# Patient Record
Sex: Male | Born: 1990 | Race: White | Hispanic: No | Marital: Single | Smoking: Current every day smoker
Health system: Southern US, Community
[De-identification: ages and names within clinical notes are randomized; demographics above are authoritative.]

---
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
## 2016-05-08 ENCOUNTER — Encounter (HOSPITAL_COMMUNITY): Payer: Self-pay

## 2016-05-08 ENCOUNTER — Emergency Department (HOSPITAL_COMMUNITY): Payer: No Typology Code available for payment source

## 2016-05-08 ENCOUNTER — Emergency Department (HOSPITAL_COMMUNITY)
Admission: EM | Admit: 2016-05-08 | Discharge: 2016-05-08 | Disposition: A | Discharge status: Home / Self Care | Payer: No Typology Code available for payment source | Attending: Emergency Medicine | Admitting: Emergency Medicine

## 2016-05-08 DIAGNOSIS — M545 Low back pain: Secondary | ICD-10-CM | POA: Insufficient documentation

## 2016-05-08 DIAGNOSIS — Y999 Unspecified external cause status: Secondary | ICD-10-CM | POA: Insufficient documentation

## 2016-05-08 DIAGNOSIS — Y939 Activity, unspecified: Secondary | ICD-10-CM | POA: Diagnosis not present

## 2016-05-08 DIAGNOSIS — M542 Cervicalgia: Secondary | ICD-10-CM | POA: Diagnosis not present

## 2016-05-08 DIAGNOSIS — Y92410 Unspecified street and highway as the place of occurrence of the external cause: Secondary | ICD-10-CM | POA: Insufficient documentation

## 2016-05-08 DIAGNOSIS — S299XXA Unspecified injury of thorax, initial encounter: Secondary | ICD-10-CM | POA: Insufficient documentation

## 2016-05-08 DIAGNOSIS — S301XXA Contusion of abdominal wall, initial encounter: Secondary | ICD-10-CM | POA: Insufficient documentation

## 2016-05-08 DIAGNOSIS — F17210 Nicotine dependence, cigarettes, uncomplicated: Secondary | ICD-10-CM | POA: Insufficient documentation

## 2016-05-08 DIAGNOSIS — S3991XA Unspecified injury of abdomen, initial encounter: Secondary | ICD-10-CM | POA: Diagnosis present

## 2016-05-08 LAB — COMPREHENSIVE METABOLIC PANEL
ALBUMIN: 4.3 g/dL (ref 3.5–5.0)
ALK PHOS: 63 U/L (ref 38–126)
ALT: 23 U/L (ref 17–63)
ANION GAP: 9 (ref 5–15)
AST: 35 U/L (ref 15–41)
BILIRUBIN TOTAL: 0.5 mg/dL (ref 0.3–1.2)
BUN: 9 mg/dL (ref 6–20)
CALCIUM: 9.3 mg/dL (ref 8.9–10.3)
CO2: 24 mmol/L (ref 22–32)
Chloride: 107 mmol/L (ref 101–111)
Creatinine, Ser: 1.09 mg/dL (ref 0.61–1.24)
GFR calc non Af Amer: >60 mL/min (ref >60)
Glucose, Bld: 80 mg/dL (ref 65–99)
POTASSIUM: 4.1 mmol/L (ref 3.5–5.1)
SODIUM: 140 mmol/L (ref 135–145)
TOTAL PROTEIN: 6.5 g/dL (ref 6.5–8.1)

## 2016-05-08 LAB — CBC WITH DIFFERENTIAL/PLATELET
BASOS PCT: 0 %
Basophils Absolute: 0.1 10*3/uL (ref 0.0–0.1)
EOS ABS: 0.4 10*3/uL (ref 0.0–0.7)
Eosinophils Relative: 2 %
HEMATOCRIT: 47.8 % (ref 39.0–52.0)
HEMOGLOBIN: 16.8 g/dL (ref 13.0–17.0)
LYMPHS ABS: 2.6 10*3/uL (ref 0.7–4.0)
Lymphocytes Relative: 17 %
MCH: 30.5 pg (ref 26.0–34.0)
MCHC: 35.1 g/dL (ref 30.0–36.0)
MCV: 86.9 fL (ref 78.0–100.0)
MONO ABS: 0.7 10*3/uL (ref 0.1–1.0)
MONOS PCT: 4 %
NEUTROS PCT: 77 %
Neutro Abs: 11.3 10*3/uL — ABNORMAL HIGH (ref 1.7–7.7)
Platelets: 287 10*3/uL (ref 150–400)
RBC: 5.5 MIL/uL (ref 4.22–5.81)
RDW: 12.3 % (ref 11.5–15.5)
WBC: 15 10*3/uL — ABNORMAL HIGH (ref 4.0–10.5)

## 2016-05-08 MED ORDER — ONDANSETRON HCL 4 MG/2ML IJ SOLN
4.0000 mg | Freq: Once | INTRAMUSCULAR | Status: AC
  Administered 2016-05-08: 4 mg via INTRAVENOUS
  Filled 2016-05-08: qty 2

## 2016-05-08 MED ORDER — MORPHINE SULFATE (PF) 4 MG/ML IV SOLN
4.0000 mg | Freq: Once | INTRAVENOUS | Status: AC
  Administered 2016-05-08: 4 mg via INTRAVENOUS
  Filled 2016-05-08: qty 1

## 2016-05-08 MED ORDER — IBUPROFEN 400 MG PO TABS
400.0000 mg | ORAL_TABLET | Freq: Once | ORAL | Status: DC | PRN
  Filled 2016-05-08: qty 1

## 2016-05-08 MED ORDER — IBUPROFEN 400 MG PO TABS
ORAL_TABLET | ORAL | Status: AC
  Administered 2016-05-08: 400 mg
  Filled 2016-05-08: qty 1
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
## 2016-05-08 NOTE — ED Notes (Signed)
Pt requesting to leave ama, EDP aware. Pt was ambulatory & understood risks of leaving

## 2016-05-08 NOTE — ED Notes (Signed)
Pt was stating if he did not receive more pain medication that he would be leaving. Provider was made aware and pt decided to leave AMA. Pt did sign AMA form
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
## 2016-05-08 NOTE — ED Notes (Signed)
Pt requesting ibuprofen for pain.

## 2016-05-08 NOTE — ED Notes (Signed)
Doctor, hospitalHighway patrol officer in triage talking with patient.

## 2016-09-10 ENCOUNTER — Encounter (HOSPITAL_COMMUNITY): Payer: Self-pay | Admitting: Emergency Medicine

## 2016-09-10 ENCOUNTER — Emergency Department (HOSPITAL_COMMUNITY)
Admission: EM | Admit: 2016-09-10 | Discharge: 2016-09-10 | Discharge status: Against Medical Advice | Payer: Self-pay | Attending: Emergency Medicine | Admitting: Emergency Medicine

## 2016-09-10 DIAGNOSIS — F17210 Nicotine dependence, cigarettes, uncomplicated: Secondary | ICD-10-CM | POA: Insufficient documentation

## 2016-09-10 DIAGNOSIS — F29 Unspecified psychosis not due to a substance or known physiological condition: Secondary | ICD-10-CM | POA: Insufficient documentation

## 2016-09-10 DIAGNOSIS — F1910 Other psychoactive substance abuse, uncomplicated: Secondary | ICD-10-CM | POA: Insufficient documentation

## 2016-09-10 LAB — I-STAT CHEM 8, ED
BUN: 19 mg/dL (ref 6–20)
Calcium, Ion: 1.19 mmol/L (ref 1.15–1.40)
Chloride: 104 mmol/L (ref 101–111)
Creatinine, Ser: 1.1 mg/dL (ref 0.61–1.24)
Glucose, Bld: 84 mg/dL (ref 65–99)
HEMATOCRIT: 47 % (ref 39.0–52.0)
HEMOGLOBIN: 16 g/dL (ref 13.0–17.0)
POTASSIUM: 3.8 mmol/L (ref 3.5–5.1)
SODIUM: 140 mmol/L (ref 135–145)
TCO2: 27 mmol/L (ref 0–100)

## 2016-09-10 LAB — CBC WITH DIFFERENTIAL/PLATELET
BASOS ABS: 0 10*3/uL (ref 0.0–0.1)
Basophils Relative: 1 %
EOS PCT: 5 %
Eosinophils Absolute: 0.3 10*3/uL (ref 0.0–0.7)
HEMATOCRIT: 47.6 % (ref 39.0–52.0)
Hemoglobin: 16.3 g/dL (ref 13.0–17.0)
LYMPHS ABS: 2 10*3/uL (ref 0.7–4.0)
LYMPHS PCT: 30 %
MCH: 29.7 pg (ref 26.0–34.0)
MCHC: 34.2 g/dL (ref 30.0–36.0)
MCV: 86.7 fL (ref 78.0–100.0)
MONOS PCT: 8 %
Monocytes Absolute: 0.6 10*3/uL (ref 0.1–1.0)
Neutro Abs: 3.8 10*3/uL (ref 1.7–7.7)
Neutrophils Relative %: 56 %
PLATELETS: 264 10*3/uL (ref 150–400)
RBC: 5.49 MIL/uL (ref 4.22–5.81)
RDW: 12.1 % (ref 11.5–15.5)
WBC: 6.8 10*3/uL (ref 4.0–10.5)

## 2016-09-10 LAB — RAPID URINE DRUG SCREEN, HOSP PERFORMED
Amphetamines: POSITIVE — AB
BENZODIAZEPINES: NOT DETECTED
Barbiturates: NOT DETECTED
Cocaine: NOT DETECTED
OPIATES: NOT DETECTED
Tetrahydrocannabinol: POSITIVE — AB

## 2016-09-10 LAB — ETHANOL: Alcohol, Ethyl (B): <5 mg/dL (ref <5)

## 2016-09-10 NOTE — ED Notes (Signed)
This RN made aware of patient leaving AMA at 2200.  Patient not in any distress, airway protected.

## 2016-09-10 NOTE — ED Notes (Signed)
At approximately 20:00 I stepped out of another room and saw patients mother in hallway. She stated that he had left running out of the hospital.  She originally left after him and had returned to get her cell phone that she had left in the patient room.  She said that he had gotten desperate and was talking to himself and left running.

## 2016-09-10 NOTE — ED Provider Notes (Signed)
[facility] Provider Note   CSN: [id number] Arrival date & time: 09/10/16  1739     History   Chief Complaint Chief Complaint  Patient presents with  . Hallucinations  . Generalized Body Aches    HPI Todd Deleon is a 26 y.o. male.  This is 26 year old male who's been using "ICE" for the past 2-3 years but for the past 4 days has been using it on a regular basis is now having crampy body aches paranoia psychosis he thinks he FBI is watching him. His mother states that he is keeping but she lives under his pillow.      History reviewed. No pertinent past medical history.  There are no active problems to display for this patient.   History reviewed. No pertinent surgical history.     Home Medications    Prior to Admission medications   Not on File    Family History No family history on file.  Social History Social History  Substance Use Topics  . Smoking status: Current Every Day Smoker    Packs/day: 0.30    Types: Cigarettes  . Smokeless tobacco: Never Used  . Alcohol use Yes     Allergies   Patient has no known allergies.   Review of Systems Review of Systems  Constitutional: Negative for chills and fever.  Musculoskeletal: Positive for myalgias.  Neurological: Negative for headaches.  Psychiatric/Behavioral: Positive for hallucinations.  All other systems reviewed and are negative.    Physical Exam Updated Vital Signs BP 114/76 (BP Location: Left Arm)   Pulse 80   Temp 98.7 F (37.1 C) (Oral)   Resp 20   Ht  (1.626 m)   Wt 56.2 kg   SpO2 99%   BMI 21.28 kg/m   Physical Exam  Constitutional: He appears well-developed and well-nourished.  HENT:  Head: Normocephalic.  Eyes: Pupils are equal, round, and reactive to light.  Neck: Normal range of motion.  Cardiovascular: Normal rate.   Pulmonary/Chest: Effort normal.  Abdominal: Soft.  Musculoskeletal: Normal range of motion.  Neurological: He is alert.  Skin: Skin is  warm and dry.  Psychiatric: His mood appears anxious. Thought content is paranoid. He expresses inappropriate judgment.  Nursing note and vitals reviewed.    ED Treatments / Results  Labs (all labs ordered are listed, but only abnormal results are displayed) Labs Reviewed  RAPID URINE DRUG SCREEN, HOSP PERFORMED - Abnormal; Notable for the following:       Result Value   Amphetamines POSITIVE (*)    Tetrahydrocannabinol POSITIVE (*)    All other components within normal limits  CBC WITH DIFFERENTIAL/PLATELET  ETHANOL  I-STAT CHEM 8, ED    EKG  EKG Interpretation None       Radiology No results found.  Procedures Procedures (including critical care time)  Medications Ordered in ED Medications - No data to display   Initial Impression / Assessment and Plan / ED Course  I have reviewed the triage vital signs and the nursing notes.  Pertinent labs & imaging results that were available during my care of the patient were reviewed by me and considered in my medical decision making (see chart for details).    Patiently and agitated he would not wait for TTS he denies suicidality or homicidality.    Final Clinical Impressions(s) / ED Diagnoses   Final diagnoses:  Psychosis, unspecified psychosis type  Polysubstance abuse    New Prescriptions New Prescriptions   No medications on file  Earley Favor, NP 09/10/16 1610    Rolland Porter, MD 09/17/16 [phone]

## 2016-09-10 NOTE — ED Notes (Signed)
Patient states that he does not want any devices on him.  States "feels like the feds are watching me".  Vitals took independently from each other.  Will continue to monitor.

## 2016-09-10 NOTE — ED Notes (Signed)
Patient stated that the "feds were outside watching him".  He refused to remain hooked up to monitor. I had to asked to let me take his vitals and I would remove him from the monitor and recheck vitals later, and he agreed. Family advised; "it would not be a bright idea to turn lights off in his room."

## 2016-09-10 NOTE — ED Notes (Signed)
EDP at bedside  

## 2016-09-10 NOTE — ED Notes (Signed)
Patient walked independently to the room.  No distress noted

## 2016-09-10 NOTE — [facility] Counselor (Signed)
Called Nurse Reita Cliche who sts that pt has left the facility. TTS consult cancelled.  Beryle Flock, MS, CRC, [facility] Triage Specialist [facility]

## 2016-09-10 NOTE — ED Triage Notes (Signed)
Mother stated, He's been hallucinating for the last 3-4 days really bad. He thinks people are coming after him, FBI. Pt. Stated, My body hurts all over, my back and all over. 2 days ago I had ICE . I used it 3-4 days straight before that time.
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
## 2019-04-22 ENCOUNTER — Emergency Department (HOSPITAL_COMMUNITY)
Admission: EM | Admit: 2019-04-22 | Discharge: 2019-04-22 | Disposition: A | Discharge status: Home / Self Care | Payer: Self-pay | Attending: Emergency Medicine | Admitting: Emergency Medicine

## 2019-04-22 ENCOUNTER — Other Ambulatory Visit: Payer: Self-pay

## 2019-04-22 ENCOUNTER — Encounter (HOSPITAL_COMMUNITY): Payer: Self-pay

## 2019-04-22 DIAGNOSIS — Z5321 Procedure and treatment not carried out due to patient leaving prior to being seen by health care provider: Secondary | ICD-10-CM | POA: Insufficient documentation

## 2019-04-22 NOTE — ED Notes (Signed)
Called again, no answer

## 2019-04-22 NOTE — ED Notes (Addendum)
Called from lobby x2 to update v/s with no answer.

## 2019-04-22 NOTE — ED Triage Notes (Signed)
Pt presents with c/o sore throat, fever, cough, and body aches. Pt reports the symptoms started last night. Pt denies any fever. Pt reports he does not believe he has been around anyone that has been sick.
